# Patient Record
Sex: Male | Born: 2000 | Race: White | Hispanic: No | Marital: Single | Smoking: Never smoker
Health system: Southern US, Community
[De-identification: ages and names within clinical notes are randomized; demographics above are authoritative.]

## PROBLEM LIST (undated history)

## (undated) DIAGNOSIS — F988 Other specified behavioral and emotional disorders with onset usually occurring in childhood and adolescence: Secondary | ICD-10-CM

---
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
## 2023-08-17 ENCOUNTER — Emergency Department (HOSPITAL_BASED_OUTPATIENT_CLINIC_OR_DEPARTMENT_OTHER): Payer: Medicaid Other

## 2023-08-17 ENCOUNTER — Encounter (HOSPITAL_BASED_OUTPATIENT_CLINIC_OR_DEPARTMENT_OTHER): Payer: Self-pay

## 2023-08-17 ENCOUNTER — Emergency Department (HOSPITAL_BASED_OUTPATIENT_CLINIC_OR_DEPARTMENT_OTHER)
Admission: EM | Admit: 2023-08-17 | Discharge: 2023-08-17 | Disposition: A | Discharge status: Home / Self Care | Payer: Medicaid Other | Attending: Emergency Medicine | Admitting: Emergency Medicine

## 2023-08-17 ENCOUNTER — Other Ambulatory Visit (HOSPITAL_BASED_OUTPATIENT_CLINIC_OR_DEPARTMENT_OTHER): Payer: Self-pay

## 2023-08-17 ENCOUNTER — Other Ambulatory Visit: Payer: Self-pay

## 2023-08-17 DIAGNOSIS — R509 Fever, unspecified: Secondary | ICD-10-CM | POA: Insufficient documentation

## 2023-08-17 DIAGNOSIS — K611 Rectal abscess: Secondary | ICD-10-CM | POA: Diagnosis present

## 2023-08-17 DIAGNOSIS — D72829 Elevated white blood cell count, unspecified: Secondary | ICD-10-CM | POA: Insufficient documentation

## 2023-08-17 LAB — BASIC METABOLIC PANEL
Anion gap: 11 (ref 5–15)
BUN: 9 mg/dL (ref 6–20)
CO2: 28 mmol/L (ref 22–32)
Calcium: 10 mg/dL (ref 8.9–10.3)
Chloride: 100 mmol/L (ref 98–111)
Creatinine, Ser: 0.76 mg/dL (ref 0.61–1.24)
GFR, Estimated: >60 mL/min (ref >60)
Glucose, Bld: 95 mg/dL (ref 70–99)
Potassium: 3.6 mmol/L (ref 3.5–5.1)
Sodium: 139 mmol/L (ref 135–145)

## 2023-08-17 LAB — CBC
HCT: 42.3 % (ref 39.0–52.0)
Hemoglobin: 14.1 g/dL (ref 13.0–17.0)
MCH: 27.3 pg (ref 26.0–34.0)
MCHC: 33.3 g/dL (ref 30.0–36.0)
MCV: 82 fL (ref 80.0–100.0)
Platelets: 411 10*3/uL — ABNORMAL HIGH (ref 150–400)
RBC: 5.16 MIL/uL (ref 4.22–5.81)
RDW: 12 % (ref 11.5–15.5)
WBC: 16.5 10*3/uL — ABNORMAL HIGH (ref 4.0–10.5)
nRBC: 0 % (ref 0.0–0.2)

## 2023-08-17 MED ORDER — LIDOCAINE-EPINEPHRINE (PF) 2 %-1:200000 IJ SOLN
20.0000 mL | Freq: Once | INTRAMUSCULAR | Status: AC
  Administered 2023-08-17: 20 mL
  Filled 2023-08-17: qty 20

## 2023-08-17 MED ORDER — IOHEXOL 300 MG/ML  SOLN
100.0000 mL | Freq: Once | INTRAMUSCULAR | Status: AC | PRN
  Administered 2023-08-17: 100 mL via INTRAVENOUS

## 2023-08-17 MED ORDER — AMOXICILLIN-POT CLAVULANATE 875-125 MG PO TABS: 1.0000 | ORAL_TABLET | Freq: Two times a day (BID) | ORAL | 0 refills | Status: AC

## 2023-08-17 MED ORDER — SODIUM CHLORIDE 0.9 % IV SOLN
1.0000 g | Freq: Once | INTRAVENOUS | Status: AC
  Administered 2023-08-17: 1 g via INTRAVENOUS
  Filled 2023-08-17: qty 10

## 2023-08-17 MED ORDER — AMOXICILLIN-POT CLAVULANATE 875-125 MG PO TABS
1.0000 | ORAL_TABLET | Freq: Two times a day (BID) | ORAL | 0 refills | Status: DC
  Filled 2023-08-17: qty 14, 7d supply, fill #0

## 2023-08-17 MED ORDER — SODIUM CHLORIDE 0.9 % IV BOLUS
1000.0000 mL | Freq: Once | INTRAVENOUS | Status: AC
Start: 2023-08-17 — End: 2023-08-17
  Administered 2023-08-17: 1000 mL via INTRAVENOUS

## 2023-08-17 NOTE — ED Provider Notes (Signed)
[place] EMERGENCY DEPARTMENT AT [facility] Provider Note   CSN: [id number] Arrival date & time: 08/17/23  0932     History  Chief Complaint  Patient presents with   Abscess    Jose Burnett is a 22 y.o. male.  HPI 22 year old male dents today complaining of pain and swelling in right buttock.  Symptoms started Saturday noted with a bump near the rectum.  He was seen on Saturday at clinic in [facility] and he was started on antibiotics.  Since that time symptoms have continued to worsen.  He has been taking p.o. Keflex.  He has had some chills and subjective fever.  He denies any previous similar symptoms.  He denies any significant past medical history.     Home Medications Prior to Admission medications   Medication Sig Start Date End Date Taking? Authorizing Provider  azelastine (ASTELIN) 0.1 % nasal spray Place 1 spray into both nostrils 2 (two) times daily. 06/18/23 06/17/24 Yes [provider]  cephALEXin (KEFLEX) 500 MG capsule Take 500 mg by mouth 4 (four) times daily. 08/14/23  Yes [provider]  levothyroxine (SYNTHROID) 112 MCG tablet Take 112 mcg by mouth daily. 01/23/21  Yes [provider]  atomoxetine (STRATTERA) 80 MG capsule Take 1 capsule (80 mg total) by mouth daily. 07/05/17   Gentry Fitz, MD  loratadine (CLARITIN) 10 MG tablet Take 10 mg by mouth daily.    [provider]      Allergies    Pollen extract    Review of Systems   Review of Systems  Physical Exam Updated Vital Signs BP 134/67   Pulse 72   Temp 98.4 F (36.9 C) (Oral)   Resp 16   Ht 1.753 m (5\' 9" )   Wt 108.9 kg   SpO2 98%   BMI 35.44 kg/m  Physical Exam Vitals reviewed.  HENT:     Head: Normocephalic.     Right Ear: External ear normal.     Left Ear: External ear normal.     Nose: Nose normal.     Mouth/Throat:     Pharynx: Oropharynx is clear.  Eyes:     Extraocular Movements: Extraocular movements intact.     Pupils: Pupils are  equal, round, and reactive to light.  Cardiovascular:     Rate and Rhythm: Normal rate and regular rhythm.     Pulses: Normal pulses.  Pulmonary:     Effort: Pulmonary effort is normal.  Abdominal:     General: Abdomen is flat.     Palpations: Abdomen is soft.  Genitourinary:    Comments: Right buttock with diffuse swelling and induration and tenderness noted Rectum does not appear to have involvement Testicles do not appear to be involved Musculoskeletal:     Cervical back: Normal range of motion.  Skin:    General: Skin is warm and dry.     Capillary Refill: Capillary refill takes less than 2 seconds.  Neurological:     General: No focal deficit present.     Mental Status: He is alert.  Psychiatric:        Mood and Affect: Mood normal.     ED Results / Procedures / Treatments   Labs (all labs ordered are listed, but only abnormal results are displayed) Labs Reviewed  CBC - Abnormal; Notable for the following components:      Result Value   WBC 16.5 (*)    Platelets 411 (*)    All other  components within normal limits  BASIC METABOLIC PANEL    EKG None  Radiology No results found.  Procedures .Marland KitchenIncision and Drainage  Date/Time: 08/17/2023 3:27 PM  Performed by: Margarita Grizzle, MD Authorized by: Margarita Grizzle, MD   Consent:    Consent obtained:  Verbal   Consent given by:  Patient   Risks, benefits, and alternatives were discussed: yes     Risks discussed:  Bleeding, incomplete drainage and pain   Alternatives discussed:  No treatment Universal protocol:    Patient identity confirmed:  Verbally with patient Location:    Type:  Abscess   Size:  Multiple small areas of fluid noted on ultrasound   Location:  Anogenital   Anogenital location: Gluteal area. Pre-procedure details:    Skin preparation:  Povidone-iodine Sedation:    Sedation type:  None Anesthesia:    Anesthesia method:  Local infiltration Procedure type:    Complexity:  Complex Procedure  details:    Ultrasound guidance: yes     Needle aspiration: no     Incision types:  Single straight   Incision depth:  Subcutaneous   Wound management:  Probed and deloculated   Drainage:  Serosanguinous   Drainage amount:  Scant   Wound treatment:  Wound left open   Packing materials:  1/4 in gauze Post-procedure details:    Procedure completion:  Tolerated     Medications Ordered in ED Medications  lidocaine-EPINEPHrine (XYLOCAINE W/EPI) 2 %-1:200000 (PF) injection 20 mL (20 mLs Infiltration Given 08/17/23 1238)  cefTRIAXone (ROCEPHIN) 1 g in sodium chloride 0.9 % 100 mL IVPB (0 g Intravenous Stopped 08/17/23 1459)  iohexol (OMNIPAQUE) 300 MG/ML solution 100 mL (100 mLs Intravenous Contrast Given 08/17/23 1331)    ED Course/ Medical Decision Making/ A&P Clinical Course as of 08/17/23 1556  Tue Aug 17, 2023  1530 CBC reviewed interpreted significant for leukocytosis 16,500 Basic metabolic panel reviewed interpreted within normal limits [DR]  1550 Assumed care from Dr Rosalia Hammers. Presented with rectal abscess since Saturday and was started on keflex then. Had I&D today in ED. Awaiting CT read at this time. Will need admission for cellulitis and IV abx that have failed OP.  [RP]    Clinical Course User Index [DR] Margarita Grizzle, MD [RP] Rondel Baton, MD                                 Medical Decision Making Amount and/or Complexity of Data Reviewed Labs: ordered. Radiology: ordered.  Risk Prescription drug management.   Patient presents today complaining of right gluteal abscess.  He had some pain and swelling that began on Saturday and has been taking antibiotics with ongoing or worsening symptoms On physical exam patient has redness induration and swelling of a significant area of the right buttock.  Ultrasound showed some diffuse scattered fluid areas.  I&D performed without significant pus noted. Patient with elevated white blood cell count Vital signs are  stable Differential diagnosis includes but is not limited to cellulitis, abscess of gluteal or deeper area, mass Patient received Rocephin 1 g IV CT obtained and awaiting results. Personally reviewed CT scan and see inflammation multiple fluid collections. This was performed post I&D. Care discussed with Dr. Erling Cruz who has accepted care and will disposition after CT results       Final Clinical Impression(s) / ED Diagnoses Final diagnoses:  None    Rx / DC Orders ED Discharge Orders  None         Margarita Grizzle, MD 08/17/23 1556

## 2023-08-17 NOTE — Discharge Instructions (Signed)
Please start taking the Augmentin that we have prescribed you.  When you start taking this medication you can stop taking the Keflex.  Take Tylenol and ibuprofen for your pain.  Follow-up with soon as possible with the general surgeons.  Try to get the first available appointment with the 3 surgeons listed above.  Return if you experience fevers, worsening pain or any other concerning symptoms.

## 2023-08-17 NOTE — ED Provider Notes (Signed)
  Physical Exam  BP 134/67   Pulse 72   Temp 98.4 F (36.9 C) (Oral)   Resp 16   Ht 5\' 9"  (1.753 m)   Wt 108.9 kg   SpO2 98%   BMI 35.44 kg/m   Physical Exam  Procedures  Procedures  ED Course / MDM   Clinical Course as of 08/17/23 1552  Tue Aug 17, 2023  1530 CBC reviewed interpreted significant for leukocytosis 16,500 Basic metabolic panel reviewed interpreted within normal limits [DR]  1550 Assumed care from Dr Rosalia Hammers. Presented with rectal abscess since Saturday and was started on keflex then. Had I&D today in ED. Awaiting CT read at this time. Will need admission for cellulitis and IV abx that have failed OP.  [RP]    Clinical Course User Index [DR] Margarita Grizzle, MD [RP] Rondel Baton, MD   Medical Decision Making Amount and/or Complexity of Data Reviewed Labs: ordered. Radiology: ordered.  Risk Prescription drug management.   ***

## 2023-08-17 NOTE — ED Triage Notes (Signed)
In for eval of abscess to left buttock onset Saturday. Went to walk-in clinic [facility] on Saturday who put him on antibiotics.  Reports another "bump" near rectum, denies pain to 2nd site.

## 2023-08-18 ENCOUNTER — Other Ambulatory Visit (HOSPITAL_BASED_OUTPATIENT_CLINIC_OR_DEPARTMENT_OTHER): Payer: Self-pay
